# Patient Record
Sex: Male | Born: 1985 | Race: White | Hispanic: No | Marital: Single | Smoking: Former smoker
Health system: Southern US, Community
[De-identification: ages and names within clinical notes are randomized; demographics above are authoritative.]

---
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
## 2011-02-23 ENCOUNTER — Emergency Department (HOSPITAL_COMMUNITY)
Admission: EM | Admit: 2011-02-23 | Discharge: 2011-02-23 | Disposition: A | Discharge status: Home / Self Care | Payer: Self-pay | Attending: Emergency Medicine | Admitting: Emergency Medicine

## 2011-02-23 ENCOUNTER — Emergency Department (HOSPITAL_COMMUNITY): Payer: Self-pay

## 2011-02-23 DIAGNOSIS — R112 Nausea with vomiting, unspecified: Secondary | ICD-10-CM | POA: Insufficient documentation

## 2011-02-23 DIAGNOSIS — K5900 Constipation, unspecified: Secondary | ICD-10-CM | POA: Insufficient documentation

## 2011-02-23 DIAGNOSIS — R109 Unspecified abdominal pain: Secondary | ICD-10-CM | POA: Insufficient documentation

## 2011-02-23 DIAGNOSIS — K439 Ventral hernia without obstruction or gangrene: Secondary | ICD-10-CM | POA: Insufficient documentation

## 2011-02-23 DIAGNOSIS — K625 Hemorrhage of anus and rectum: Secondary | ICD-10-CM | POA: Insufficient documentation

## 2011-02-23 LAB — CBC
Hemoglobin: 13.2 g/dL (ref 13.0–17.0)
MCH: 28.8 pg (ref 26.0–34.0)
MCHC: 34.1 g/dL (ref 30.0–36.0)
Platelets: 211 10*3/uL (ref 150–400)
RDW: 13.4 % (ref 11.5–15.5)
WBC: 8.3 10*3/uL (ref 4.0–10.5)

## 2011-02-23 LAB — COMPREHENSIVE METABOLIC PANEL
AST: 20 U/L (ref 0–37)
Alkaline Phosphatase: 55 U/L (ref 39–117)
Calcium: 9.8 mg/dL (ref 8.4–10.5)
GFR calc Af Amer: >60 mL/min (ref >60)
Sodium: 140 mEq/L (ref 135–145)
Total Bilirubin: 0.2 mg/dL — ABNORMAL LOW (ref 0.3–1.2)
Total Protein: 6.7 g/dL (ref 6.0–8.3)

## 2011-02-23 LAB — DIFFERENTIAL
Basophils Relative: 1 % (ref 0–1)
Eosinophils Relative: 8 % — ABNORMAL HIGH (ref 0–5)
Lymphocytes Relative: 26 % (ref 12–46)
Monocytes Relative: 7 % (ref 3–12)
Neutrophils Relative %: 58 % (ref 43–77)

## 2011-02-23 LAB — OCCULT BLOOD, POC DEVICE: Fecal Occult Bld: POSITIVE

## 2011-02-23 LAB — LIPASE, BLOOD: Lipase: 16 U/L (ref 11–59)

## 2012-04-21 ENCOUNTER — Emergency Department: Payer: Self-pay | Admitting: Emergency Medicine

## 2012-04-21 LAB — COMPREHENSIVE METABOLIC PANEL
Albumin: 4 g/dL (ref 3.4–5.0)
BUN: 10 mg/dL (ref 7–18)
EGFR (African American): >60
EGFR (Non-African Amer.): >60
Glucose: 153 mg/dL — ABNORMAL HIGH (ref 65–99)
Potassium: 3.2 mmol/L — ABNORMAL LOW (ref 3.5–5.1)
SGOT(AST): 31 U/L (ref 15–37)
Total Protein: 7.6 g/dL (ref 6.4–8.2)

## 2012-04-21 LAB — URINALYSIS, COMPLETE
Bilirubin,UR: NEGATIVE
Blood: NEGATIVE
Leukocyte Esterase: NEGATIVE
Nitrite: NEGATIVE
Ph: 6 (ref 4.5–8.0)
Protein: NEGATIVE
RBC,UR: 1 /HPF (ref 0–5)
Squamous Epithelial: <1

## 2012-04-21 LAB — CBC
HCT: 43.9 % (ref 40.0–52.0)
HGB: 14.5 g/dL (ref 13.0–18.0)
MCHC: 33 g/dL (ref 32.0–36.0)
RBC: 5.14 10*6/uL (ref 4.40–5.90)

## 2017-11-26 ENCOUNTER — Emergency Department: Payer: No Typology Code available for payment source

## 2017-11-26 ENCOUNTER — Other Ambulatory Visit: Payer: Self-pay

## 2017-11-26 ENCOUNTER — Emergency Department
Admission: EM | Admit: 2017-11-26 | Discharge: 2017-11-26 | Disposition: A | Discharge status: Home / Self Care | Payer: No Typology Code available for payment source | Attending: Emergency Medicine | Admitting: Emergency Medicine

## 2017-11-26 DIAGNOSIS — S59912A Unspecified injury of left forearm, initial encounter: Secondary | ICD-10-CM | POA: Diagnosis present

## 2017-11-26 DIAGNOSIS — Y9289 Other specified places as the place of occurrence of the external cause: Secondary | ICD-10-CM | POA: Insufficient documentation

## 2017-11-26 DIAGNOSIS — Y9355 Activity, bike riding: Secondary | ICD-10-CM | POA: Insufficient documentation

## 2017-11-26 DIAGNOSIS — Y998 Other external cause status: Secondary | ICD-10-CM | POA: Insufficient documentation

## 2017-11-26 DIAGNOSIS — Z87891 Personal history of nicotine dependence: Secondary | ICD-10-CM | POA: Diagnosis not present

## 2017-11-26 DIAGNOSIS — S52125A Nondisplaced fracture of head of left radius, initial encounter for closed fracture: Secondary | ICD-10-CM | POA: Insufficient documentation

## 2017-11-26 MED ORDER — HYDROCODONE-ACETAMINOPHEN 5-325 MG PO TABS: 1.0000 | ORAL_TABLET | Freq: Four times a day (QID) | ORAL | 0 refills | Status: AC | PRN

## 2017-11-26 MED ORDER — IBUPROFEN 800 MG PO TABS
800.0000 mg | ORAL_TABLET | Freq: Once | ORAL | Status: AC
Start: 2017-11-26 — End: 2017-11-26
  Administered 2017-11-26: 800 mg via ORAL
  Filled 2017-11-26: qty 1

## 2017-11-26 MED ORDER — IBUPROFEN 800 MG PO TABS: 800.0000 mg | ORAL_TABLET | Freq: Three times a day (TID) | ORAL | 0 refills | Status: AC | PRN
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
## 2017-11-26 NOTE — Discharge Instructions (Signed)
Please wear splint at all times, keep splint clean and dry.  Use sling as needed for comfort.  Take ibuprofen and Tylenol as needed for mild to moderate pain.  He may use Norco as needed for more severe pain.  Please call orthopedic office tomorrow morning to schedule follow-up appointment.  Return to the emergency department for any severe pain worsening symptoms or to changes in her health.
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
## 2018-06-18 ENCOUNTER — Ambulatory Visit: Payer: Self-pay

## 2018-06-20 ENCOUNTER — Ambulatory Visit: Payer: Self-pay

## 2018-06-27 ENCOUNTER — Telehealth: Payer: Self-pay | Admitting: Adult Health Nurse Practitioner

## 2018-06-27 ENCOUNTER — Ambulatory Visit: Payer: Self-pay | Admitting: Licensed Clinical Social Worker

## 2018-06-27 NOTE — Telephone Encounter (Signed)
Pt wanted to reschedule counseling apt at [facility] and know when next apt was. Rescheduled to 07/02/18

## 2018-07-02 ENCOUNTER — Telehealth: Payer: Self-pay | Admitting: Adult Health Nurse Practitioner

## 2018-07-02 ENCOUNTER — Institutional Professional Consult (permissible substitution): Payer: Self-pay | Admitting: Licensed Clinical Social Worker

## 2018-07-02 NOTE — Telephone Encounter (Signed)
Called back to reschedule missed counseling appointment on 10/1 but no answer.

## 2018-07-04 ENCOUNTER — Ambulatory Visit: Payer: Self-pay

## 2018-10-13 ENCOUNTER — Emergency Department
Admission: EM | Admit: 2018-10-13 | Discharge: 2018-10-13 | Disposition: A | Discharge status: Home / Self Care | Payer: Self-pay | Attending: Emergency Medicine | Admitting: Emergency Medicine

## 2018-10-13 ENCOUNTER — Other Ambulatory Visit: Payer: Self-pay

## 2018-10-13 DIAGNOSIS — Y929 Unspecified place or not applicable: Secondary | ICD-10-CM | POA: Insufficient documentation

## 2018-10-13 DIAGNOSIS — S51812A Laceration without foreign body of left forearm, initial encounter: Secondary | ICD-10-CM | POA: Insufficient documentation

## 2018-10-13 DIAGNOSIS — Y939 Activity, unspecified: Secondary | ICD-10-CM | POA: Insufficient documentation

## 2018-10-13 DIAGNOSIS — W268XXA Contact with other sharp object(s), not elsewhere classified, initial encounter: Secondary | ICD-10-CM | POA: Insufficient documentation

## 2018-10-13 DIAGNOSIS — Z87891 Personal history of nicotine dependence: Secondary | ICD-10-CM | POA: Insufficient documentation

## 2018-10-13 DIAGNOSIS — Z23 Encounter for immunization: Secondary | ICD-10-CM | POA: Insufficient documentation

## 2018-10-13 DIAGNOSIS — Y998 Other external cause status: Secondary | ICD-10-CM | POA: Insufficient documentation
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
## 2018-10-13 MED ORDER — BACITRACIN-NEOMYCIN-POLYMYXIN 400-5-5000 EX OINT
TOPICAL_OINTMENT | Freq: Once | CUTANEOUS | Status: AC
  Administered 2018-10-13: 22:00:00 via TOPICAL
  Filled 2018-10-13: qty 1

## 2018-10-13 MED ORDER — LIDOCAINE HCL (PF) 1 % IJ SOLN
5.0000 mL | Freq: Once | INTRAMUSCULAR | Status: AC
  Administered 2018-10-13: 5 mL via INTRADERMAL
  Filled 2018-10-13: qty 5

## 2018-10-13 MED ORDER — TETANUS-DIPHTH-ACELL PERTUSSIS 5-2.5-18.5 LF-MCG/0.5 IM SUSP
0.5000 mL | Freq: Once | INTRAMUSCULAR | Status: AC
  Administered 2018-10-13: 0.5 mL via INTRAMUSCULAR
  Filled 2018-10-13: qty 0.5

## 2018-10-13 NOTE — ED Notes (Signed)
Ointment applied and area covered with Telfa pad and gauze.
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
## 2018-10-13 NOTE — ED Notes (Signed)
Pt has laceration noted to left inner wrist area. Bleeding controlled at this time.

## 2018-10-13 NOTE — Discharge Instructions (Signed)
Do not get the sutured area wet for 24 hours. After 24 hours, shower/bathe as usual and pat the area dry. °Change the bandage 2 times per day and apply antibiotic ointment. °Leave open to air when at no risk of getting the area dirty, but cover at night before bed. °See your PCP or go to Urgent Care in 10 days for suture removal or sooner for signs or concern of infection. ° °
